# Patient Record
Sex: Male | Born: 1945 | Race: White | Hispanic: No | Marital: Married
Health system: Southern US, Community
[De-identification: ages and names within clinical notes are randomized; demographics above are authoritative.]

---

## 2007-01-15 ENCOUNTER — Ambulatory Visit (HOSPITAL_COMMUNITY): Admission: RE | Admit: 2007-01-15 | Discharge: 2007-01-16 | Payer: Self-pay | Admitting: Neurosurgery

## 2008-10-12 IMAGING — CR DG CHEST 2V
2 series · 2 of 2 positions shown · non-contrast
Comparison: None.

CLINICAL DATA: Preoperative respiratory exam for lumbar surgery.  Hypertension. 
 CHEST ? 2 VIEW:

[view not recorded (1 of 2)]
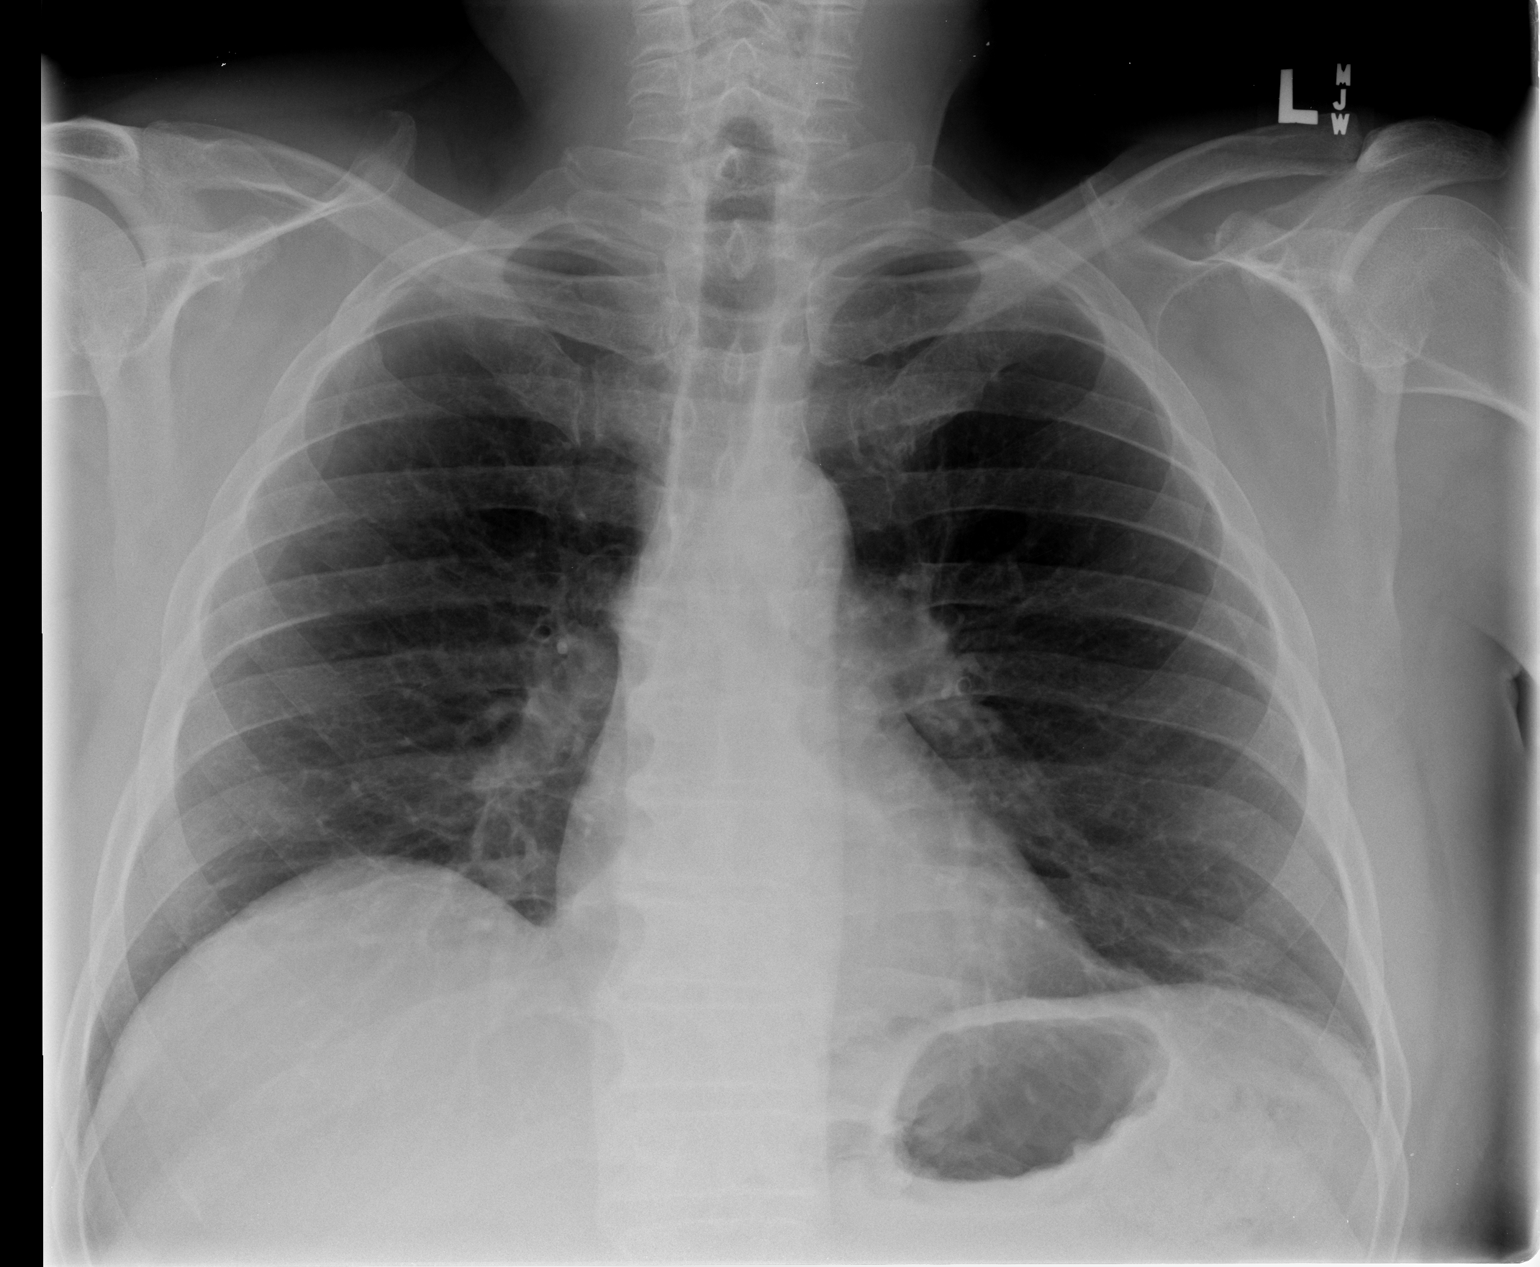

[view not recorded (2 of 2)]
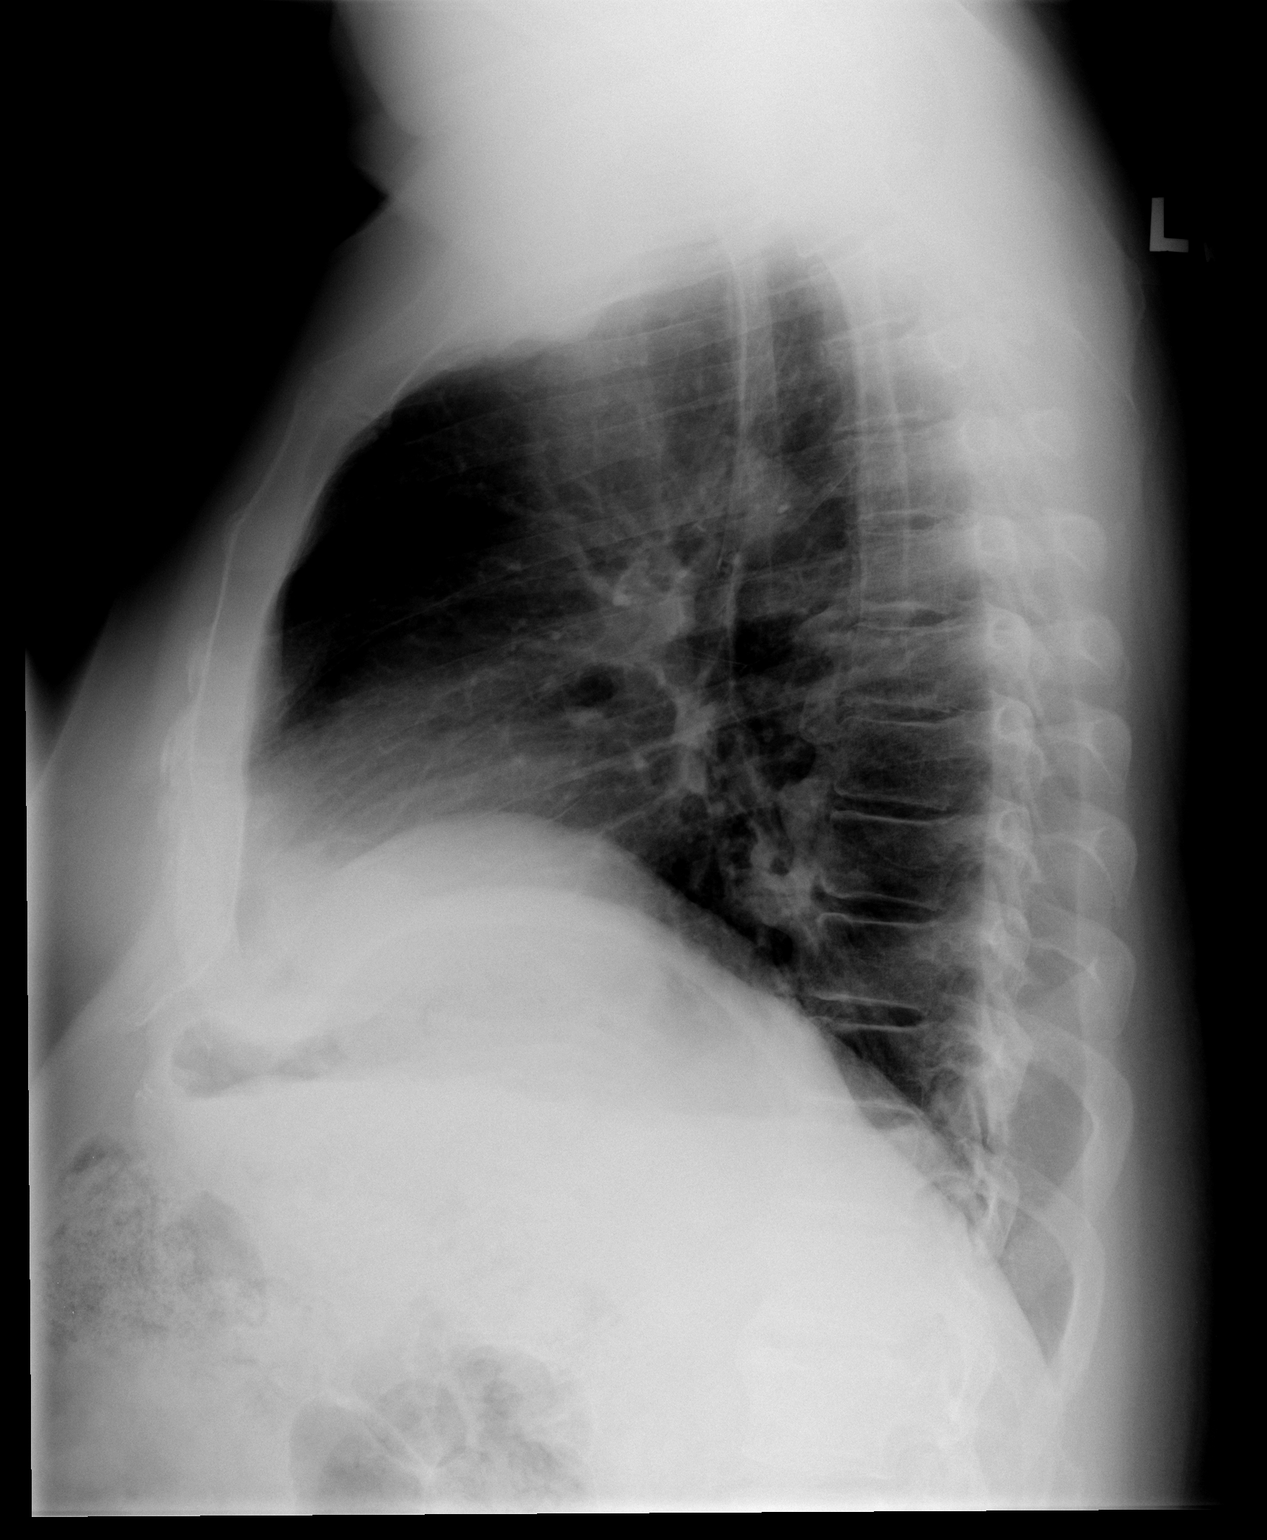

[2 of 2 positions shown; findings below may reference images not displayed]

FINDINGS: Heart size is normal.  The mediastinum is unremarkable.  The lungs are clear.  Osteophytes are seen in the thoracic region.
IMPRESSION: No active disease.

## 2010-07-26 NOTE — Op Note (Signed)
NAMETORBEN, SOLOWAY NO.:  [id number]   MEDICAL RECORD NO.:  [id number]          PATIENT TYPE:  OIB   LOCATION:  3007                         FACILITY:  [facility]   PHYSICIAN:  Danae Orleans. Venetia Maxon, M.D.  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   PROCEDURE:  Right L5-S1 microdiskectomy with microdissection.   SURGEON:  Danae Orleans. Venetia Maxon, M.D.   ASSISTANT:  Georgiann Cocker, RN and Hilda Lias, M.D.   ANESTHESIA:  General endotracheal anesthesia.   BLOOD LOSS:  Minimal.   COMPLICATIONS:  None.   DISPOSITION:  Recovery.   INDICATIONS:  Brandon Hinton is a 65 year old man with a free fragment  disk herniation at L5-S1 on the right causing right S1 nerve root  compression.  It was elected to take him to surgery for microdiskectomy.   PROCEDURE:  The patient was brought to the operating room.  Following  satisfactory uncomplicated induction of general endotracheal anesthesia  and placement of intravenous lines, the patient was placed in the prone  position on the Wilson frame.  His low back was then shaved and prepped  and draped in the usual sterile fashion.  Area of planned incision was  infiltrated with 0.25% Marcaine and 0.50% lidocaine with 1:200,000  epinephrine.  Incision was made in the midline, carried through adipose  tissue to the lumbodorsal fascia.  It was incised on the right side of  midline.  Subperiosteal dissection was performed exposing the L5-S1  interspace and this was confirmed on intraoperative x-ray.  Subsequently  a hemi semi-laminectomy of L5 was performed with a high-speed drill and  completed with Kerrison rongeurs and  a foraminotomy was performed  overlying the S1 nerve root by removal of the superior aspect of the S1  lamina.  Subsequently microscope was brought into the field and using  microdissection technique the common dural tube and S1 nerve root were  mobilized medially exposing a free fragment of herniated disk material.  The free fragment was removed.  Multiple fragments of disk material were  removed.  There was annular disruption overlying the interspace but  there did not appear to be a significant amount of free disk material  and it was elected not to instrument the interspace more aggressively.  Using a variety of ball probes the floor of canal was palpated and the  course of the S1 nerve root was palpated.  There did not appear to be  residual compression.  Hemostasis was assured with Gelfoam soaked in  thrombin.  Subsequently the operative site was bathed in Depo-Medrol and  fentanyl.  The self-retaining retractor was removed.  The lumbodorsal  fascia was closed with O Vicryl sutures, subcutaneous tissues were  reapproximated with 2-0 Vicryl interrupted inverted sutures.  Skin  edges were approximated with interrupted 3-0 Vicryl subcuticular stitch.  The wound was dressed with Dermabond.  The patient was extubated in the  operating room, taken to the recovery room in stable satisfactory  condition having tolerated his operation well.  Counts were correct at  the end of the case.      Danae Orleans. Venetia Maxon, M.D.  Electronically Signed     JDS/MEDQ  D:  01/15/2007  T:  01/16/2007  Job:  [id number]

## 2010-12-21 LAB — CBC
Platelets: 243
RBC: 4.6
RDW: 13
WBC: 6.2

## 2010-12-21 LAB — BASIC METABOLIC PANEL
BUN: 14
CO2: 30
Calcium: 9.3
Glucose, Bld: 62 — ABNORMAL LOW
Potassium: 4
Sodium: 135

## 2011-09-06 ENCOUNTER — Other Ambulatory Visit: Payer: Self-pay | Admitting: Neurosurgery

## 2011-09-06 ENCOUNTER — Other Ambulatory Visit (HOSPITAL_COMMUNITY): Payer: Self-pay | Admitting: Neurosurgery

## 2011-09-06 DIAGNOSIS — IMO0002 Reserved for concepts with insufficient information to code with codable children: Secondary | ICD-10-CM

## 2011-09-06 DIAGNOSIS — M549 Dorsalgia, unspecified: Secondary | ICD-10-CM

## 2011-09-13 ENCOUNTER — Ambulatory Visit
Admission: RE | Admit: 2011-09-13 | Discharge: 2011-09-13 | Disposition: A | Discharge status: Home / Self Care | Payer: Medicare Other | Source: Ambulatory Visit | Attending: Neurosurgery | Admitting: Neurosurgery

## 2011-09-13 DIAGNOSIS — IMO0002 Reserved for concepts with insufficient information to code with codable children: Secondary | ICD-10-CM

## 2011-09-13 MED ORDER — GADOBENATE DIMEGLUMINE 529 MG/ML IV SOLN
20.0000 mL | Freq: Once | INTRAVENOUS | Status: AC | PRN
  Administered 2011-09-13: 20 mL via INTRAVENOUS

## 2022-10-16 ENCOUNTER — Telehealth: Payer: Self-pay | Admitting: Otolaryngology

## 2022-10-16 NOTE — Telephone Encounter (Signed)
Pt wants to check with insurance before making an apt, he stated he will call back

## 2022-10-30 ENCOUNTER — Telehealth (INDEPENDENT_AMBULATORY_CARE_PROVIDER_SITE_OTHER): Payer: Self-pay | Admitting: Physician Assistant

## 2022-10-30 NOTE — Telephone Encounter (Signed)
Called to see if he has spoken to insurance and see if he would like to be seen. LVM
